# Patient Record
Sex: Female | Born: 1985 | Race: Black or African American | Hispanic: No | Marital: Single | Smoking: Current every day smoker
Health system: Southern US, Community
[De-identification: ages and names within clinical notes are randomized; demographics above are authoritative.]

## PROBLEM LIST (undated history)

## (undated) ENCOUNTER — Emergency Department (HOSPITAL_COMMUNITY): Discharge status: Absent without leave | Payer: No Typology Code available for payment source

---

## 2013-03-06 ENCOUNTER — Emergency Department (HOSPITAL_COMMUNITY)
Admission: EM | Admit: 2013-03-06 | Discharge: 2013-03-06 | Disposition: A | Discharge status: Home / Self Care | Payer: 59 | Attending: Emergency Medicine | Admitting: Emergency Medicine

## 2013-03-06 ENCOUNTER — Encounter (HOSPITAL_COMMUNITY): Payer: Self-pay | Admitting: Emergency Medicine

## 2013-03-06 ENCOUNTER — Emergency Department (INDEPENDENT_AMBULATORY_CARE_PROVIDER_SITE_OTHER)
Admission: EM | Admit: 2013-03-06 | Discharge: 2013-03-06 | Disposition: A | Discharge status: Hospice | Payer: Self-pay | Source: Home / Self Care | Attending: Family Medicine | Admitting: Family Medicine

## 2013-03-06 DIAGNOSIS — H53149 Visual discomfort, unspecified: Secondary | ICD-10-CM | POA: Insufficient documentation

## 2013-03-06 DIAGNOSIS — H538 Other visual disturbances: Secondary | ICD-10-CM | POA: Insufficient documentation

## 2013-03-06 DIAGNOSIS — R51 Headache: Secondary | ICD-10-CM

## 2013-03-06 DIAGNOSIS — R2990 Unspecified symptoms and signs involving the nervous system: Secondary | ICD-10-CM

## 2013-03-06 DIAGNOSIS — R29818 Other symptoms and signs involving the nervous system: Secondary | ICD-10-CM

## 2013-03-06 DIAGNOSIS — R519 Headache, unspecified: Secondary | ICD-10-CM

## 2013-03-06 DIAGNOSIS — Z3202 Encounter for pregnancy test, result negative: Secondary | ICD-10-CM | POA: Insufficient documentation

## 2013-03-06 DIAGNOSIS — J3489 Other specified disorders of nose and nasal sinuses: Secondary | ICD-10-CM | POA: Insufficient documentation

## 2013-03-06 LAB — POCT PREGNANCY, URINE: PREG TEST UR: NEGATIVE

## 2013-03-06 MED ORDER — METOCLOPRAMIDE HCL 5 MG/ML IJ SOLN
10.0000 mg | Freq: Once | INTRAMUSCULAR | Status: AC
  Administered 2013-03-06: 10 mg via INTRAVENOUS
  Filled 2013-03-06: qty 2

## 2013-03-06 MED ORDER — IBUPROFEN 600 MG PO TABS: 600.0000 mg | ORAL_TABLET | Freq: Four times a day (QID) | ORAL | Status: DC | PRN

## 2013-03-06 MED ORDER — DIPHENHYDRAMINE HCL 50 MG/ML IJ SOLN
25.0000 mg | Freq: Once | INTRAMUSCULAR | Status: AC
  Administered 2013-03-06: 25 mg via INTRAVENOUS
  Filled 2013-03-06: qty 1

## 2013-03-06 MED ORDER — DEXAMETHASONE SODIUM PHOSPHATE 10 MG/ML IJ SOLN
10.0000 mg | Freq: Once | INTRAMUSCULAR | Status: AC
  Administered 2013-03-06: 10 mg via INTRAVENOUS
  Filled 2013-03-06: qty 1

## 2013-03-06 MED ORDER — SODIUM CHLORIDE 0.9 % IV BOLUS (SEPSIS)
1000.0000 mL | Freq: Once | INTRAVENOUS | Status: AC
  Administered 2013-03-06: 1000 mL via INTRAVENOUS

## 2013-03-06 NOTE — ED Provider Notes (Signed)
Medical screening examination/treatment/procedure(s) were performed by a resident physician or non-physician practitioner and as the supervising physician I was immediately available for consultation/collaboration.  Jabarie Pop, MD    Nahum Sherrer S Santanna Olenik, MD 03/06/13 2029 

## 2013-03-06 NOTE — Discharge Instructions (Signed)
Migraine Headache A migraine headache is an intense, throbbing pain on one or both sides of your head. A migraine can last for 30 minutes to several hours. CAUSES  The exact cause of a migraine headache is not always known. However, a migraine may be caused when nerves in the brain become irritated and release chemicals that cause inflammation. This causes pain. Certain things may also trigger migraines, such as:  Alcohol.  Smoking.  Stress.  Menstruation.  Aged cheeses.  Foods or drinks that contain nitrates, glutamate, aspartame, or tyramine.  Lack of sleep.  Chocolate.  Caffeine.  Hunger.  Physical exertion.  Fatigue.  Medicines used to treat chest pain (nitroglycerine), birth control pills, estrogen, and some blood pressure medicines. SIGNS AND SYMPTOMS  Pain on one or both sides of your head.  Pulsating or throbbing pain.  Severe pain that prevents daily activities.  Pain that is aggravated by any physical activity.  Nausea, vomiting, or both.  Dizziness.  Pain with exposure to bright lights, loud noises, or activity.  General sensitivity to bright lights, loud noises, or smells. Before you get a migraine, you may get warning signs that a migraine is coming (aura). An aura may include:  Seeing flashing lights.  Seeing bright spots, halos, or zig-zag lines.  Having tunnel vision or blurred vision.  Having feelings of numbness or tingling.  Having trouble talking.  Having muscle weakness. DIAGNOSIS  A migraine headache is often diagnosed based on:  Symptoms.  Physical exam.  A CT scan or MRI of your head. These imaging tests cannot diagnose migraines, but they can help rule out other causes of headaches. TREATMENT Medicines may be given for pain and nausea. Medicines can also be given to help prevent recurrent migraines.  HOME CARE INSTRUCTIONS  Only take over-the-counter or prescription medicines for pain or discomfort as directed by your  health care provider. The use of long-term narcotics is not recommended.  Lie down in a dark, quiet room when you have a migraine.  Keep a journal to find out what may trigger your migraine headaches. For example, write down:  What you eat and drink.  How much sleep you get.  Any change to your diet or medicines.  Limit alcohol consumption.  Quit smoking if you smoke.  Get 7 9 hours of sleep, or as recommended by your health care provider.  Limit stress.  Keep lights dim if bright lights bother you and make your migraines worse. SEEK IMMEDIATE MEDICAL CARE IF:   Your migraine becomes severe.  You have a fever.  You have a stiff neck.  You have vision loss.  You have muscular weakness or loss of muscle control.  You start losing your balance or have trouble walking.  You feel faint or pass out.  You have severe symptoms that are different from your first symptoms. MAKE SURE YOU:   Understand these instructions.  Will watch your condition.  Will get help right away if you are not doing well or get worse. Document Released: 01/14/2005 Document Revised: 11/04/2012 Document Reviewed: 09/21/2012 [employer] Patient Information 2014 [employer].  

## 2013-03-06 NOTE — ED Notes (Signed)
C/o headache States she has had a headache since Monday Has used tylenol and advil but no relief.  States she has had nausea this morning only  Does have ringing in head Does have head pressure

## 2013-03-06 NOTE — ED Notes (Signed)
MD at bedside to reevaluate patient

## 2013-03-06 NOTE — ED Notes (Signed)
[facility] headache intermittent since Monday and ringing in ears. Took otc meds with no relief. Pain is better when lying flat. Denies fevers

## 2013-03-06 NOTE — ED Provider Notes (Signed)
CSN: [id number]     Arrival date & time 03/06/13  [id number] History   First MD Initiated Contact with Patient 03/06/13 1003     Chief Complaint  Patient presents with  . Headache   (Consider location/radiation/quality/duration/timing/severity/associated sxs/prior Treatment) HPI Comments: 28 year old female presents for evaluation of headache. Since Monday of this week, she has had a constant headache that increases and decreases in severity behind both eyes. It feels like severe pressure in her head. In addition to the headache, she has also had some nausea, ringing in her ears, lightheadedness, vertigo. She has taken ibuprofen and indomethacin for this, it helped slightly but the headache just returns when the medicine wears off. Headache is somewhat relieved by lying flat and is exacerbated by sitting up. She does not feel sick at this time. She has no significant history of headaches and no history of migraines.  Patient is a 28 y.o. female presenting with headaches.  Headache Associated symptoms: dizziness and photophobia   Associated symptoms: no abdominal pain, no cough, no fever, no myalgias, no nausea and no vomiting     History reviewed. No pertinent past medical history. No past surgical history on file. No family history on file. History  Substance Use Topics  . Smoking status: Not on file  . Smokeless tobacco: Not on file  . Alcohol Use: Not on file   OB History   Grav Para Term Preterm Abortions TAB SAB Ect Mult Living                 Review of Systems  Constitutional: Negative for fever and chills.  Eyes: Positive for photophobia and visual disturbance.  Respiratory: Negative for cough and shortness of breath.   Cardiovascular: Negative for chest pain, palpitations and leg swelling.  Gastrointestinal: Negative for nausea, vomiting and abdominal pain.  Endocrine: Negative for polydipsia and polyuria.  Genitourinary: Negative for dysuria, urgency and frequency.   Musculoskeletal: Negative for arthralgias and myalgias.  Skin: Negative for rash.  Neurological: Positive for dizziness, light-headedness and headaches. Negative for weakness.    Allergies  Review of patient's allergies indicates no known allergies.  Home Medications  No current outpatient prescriptions on file. BP 117/74  Pulse 72  Temp(Src) 98.5 F (36.9 C) (Oral)  Resp 22  SpO2 100% Physical Exam  Nursing note and vitals reviewed. Constitutional: She is oriented to person, place, and time. Vital signs are normal. She appears well-developed and well-nourished. No distress.  HENT:  Head: Normocephalic and atraumatic.  Nose: Right sinus exhibits frontal sinus tenderness. Left sinus exhibits frontal sinus tenderness.  Eyes: Right eye exhibits abnormal extraocular motion. Left eye exhibits abnormal extraocular motion.  Gaze tracking is abnormal, not smooth   Does not tolerate fundoscopic exam   Cardiovascular: Normal rate, regular rhythm and normal heart sounds.  Exam reveals no gallop and no friction rub.   No murmur heard. Pulmonary/Chest: Effort normal and breath sounds normal. No respiratory distress. She has no wheezes. She has no rales.  Neurological: She is alert and oriented to person, place, and time. She has normal strength and normal reflexes. A cranial nerve deficit (EOMs abnormal) is present. No sensory deficit. She exhibits normal muscle tone. She displays a negative Romberg sign. Coordination and gait normal. GCS eye subscore is 4. GCS verbal subscore is 5. GCS motor subscore is 6.  Skin: Skin is warm and dry. No rash noted. She is not diaphoretic.  Psychiatric: She has a normal mood and affect. Judgment normal.  ED Course  Procedures (including critical care time) Labs Review Labs Reviewed - No data to display Imaging Review No results found.    MDM   1. Headache   2. Abnormal neurological exam    Dr. Denyse Amass has seen this patient as well, but believe  this patient needs further evaluation in the emergency department given the headache with abnormal neurologic exam and no relevant history. Transferred to ED via shuttle    Graylon Good, PA-C 03/06/13 1039

## 2013-03-06 NOTE — ED Provider Notes (Signed)
CSN: [id number]     Arrival date & time 03/06/13  1046 History   First MD Initiated Contact with Patient 03/06/13 1108     Chief Complaint  Patient presents with  . Headache   (Consider location/radiation/quality/duration/timing/severity/associated sxs/prior Treatment) Patient is a 28 y.o. female presenting with headaches. The history is provided by the patient.  Headache Pain location:  Frontal Quality:  Sharp Radiates to:  Does not radiate Severity currently:  9/10 Severity at highest:  9/10 Onset quality:  Gradual Duration:  6 days Timing:  Constant Progression:  Waxing and waning Chronicity:  New Similar to prior headaches: no   Context: bright light   Context: not caffeine, not coughing, not loud noise and not straining   Relieved by:  Nothing Worsened by:  Nothing tried Associated symptoms: blurred vision (when trying to focus, not constantly), photophobia and sinus pressure   Associated symptoms: no abdominal pain, no cough, no diarrhea, no dizziness, no drainage, no facial pain, no fever, no nausea, no neck stiffness, no numbness and no URI     History reviewed. No pertinent past medical history. History reviewed. No pertinent past surgical history. History reviewed. No pertinent family history. History  Substance Use Topics  . Smoking status: Not on file  . Smokeless tobacco: Not on file  . Alcohol Use: Not on file   OB History   Grav Para Term Preterm Abortions TAB SAB Ect Mult Living                 Review of Systems  Constitutional: Negative for fever.  HENT: Positive for sinus pressure. Negative for postnasal drip.   Eyes: Positive for blurred vision (when trying to focus, not constantly) and photophobia.  Respiratory: Negative for cough.   Gastrointestinal: Negative for nausea, abdominal pain and diarrhea.  Musculoskeletal: Negative for neck stiffness.  Neurological: Positive for headaches. Negative for dizziness and numbness.  All other systems  reviewed and are negative.    Allergies  Review of patient's allergies indicates no known allergies.  Home Medications  No current outpatient prescriptions on file. BP 121/66  Pulse 67  Temp(Src) 98.3 F (36.8 C) (Oral)  Resp 20  Ht 5\' 5"  (1.651 m)  Wt 125 lb (56.7 kg)  BMI 20.80 kg/m2  SpO2 98% Physical Exam  Nursing note and vitals reviewed. Constitutional: She is oriented to person, place, and time. She appears well-developed and well-nourished. No distress.  HENT:  Head: Normocephalic and atraumatic.  Eyes: EOM are normal. Pupils are equal, round, and reactive to light.  Neck: Normal range of motion. Neck supple.  Cardiovascular: Normal rate and regular rhythm.  Exam reveals no friction rub.   No murmur heard. Pulmonary/Chest: Effort normal and breath sounds normal. No respiratory distress. She has no wheezes. She has no rales.  Abdominal: Soft. She exhibits no distension. There is no tenderness. There is no rebound.  Musculoskeletal: Normal range of motion. She exhibits no edema.  Neurological: She is alert and oriented to person, place, and time. No cranial nerve deficit or sensory deficit. She exhibits normal muscle tone. Coordination and gait normal. GCS eye subscore is 4. GCS verbal subscore is 5. GCS motor subscore is 6.  Normal EOMs on my exam  Skin: She is not diaphoretic.    ED Course  Procedures (including critical care time) Labs Review Labs Reviewed  POCT PREGNANCY, URINE   Imaging Review No results found.  EKG Interpretation   None       MDM  1. Headache    28 year old female sent here for headache from urgent care. At urgent care it was noted that she had abnormal extraocular movement. She had difficulty with tracking per their exam. She's here for further evaluation. Headache has been constant since Monday, which is 6 days ago. He was gradual onset with waxing and waning periods. No fever, rhinorrhea, sore throat, nausea, vomiting. She does  not have a history of headaches. I spoke with the PA at Urgent Care who evaluated her, he described nystagmus as the abnormal EOMs. Patient here without nystagmus, relaxing comfortably, cranial nerves normal.  Will give headache cocktail.  Patient sleeping comfortably on re-exam, and on second re-exam, states headache resolved. Stable for discharge.   Dagmar Hait, MD 03/06/13 1329

## 2013-06-08 ENCOUNTER — Encounter: Payer: Self-pay | Admitting: Family

## 2013-06-08 ENCOUNTER — Telehealth: Payer: Self-pay | Admitting: Family

## 2013-06-08 ENCOUNTER — Ambulatory Visit (INDEPENDENT_AMBULATORY_CARE_PROVIDER_SITE_OTHER): Payer: 59 | Admitting: Family

## 2013-06-08 VITALS — BP 100/60 | HR 59 | Ht 63.75 in | Wt 133.0 lb

## 2013-06-08 DIAGNOSIS — Z0000 Encounter for general adult medical examination without abnormal findings: Secondary | ICD-10-CM

## 2013-06-08 DIAGNOSIS — Z3009 Encounter for other general counseling and advice on contraception: Secondary | ICD-10-CM

## 2013-06-08 DIAGNOSIS — Z23 Encounter for immunization: Secondary | ICD-10-CM

## 2013-06-08 NOTE — Progress Notes (Signed)
Subjective:    Patient ID: Patricia Beard, female    DOB: 1985-05-14, 28 y.o.   MRN: [id number]  HPI 28 year old [place], new patient to the practice and to be established and for complete physical exam. Denies any concerns. Is requesting a referral to gynecology to have IUD removed. She would like replacement. Is a 27-year-old female child who is autistic.   Review of Systems  Constitutional: Negative.   HENT: Negative.   Eyes: Negative.   Respiratory: Negative.   Cardiovascular: Negative.   Gastrointestinal: Negative.   Endocrine: Negative.   Genitourinary: Negative.   Musculoskeletal: Negative.   Skin: Negative.   Allergic/Immunologic: Negative.   Neurological: Negative.   Hematological: Negative.   Psychiatric/Behavioral: Negative.    No past medical history on file.  History   Social History  . Marital Status: Single    Spouse Name: N/A    Number of Children: N/A  . Years of Education: N/A   Occupational History  . Not on file.   Social History Main Topics  . Smoking status: Current Every Day Smoker  . Smokeless tobacco: Not on file  . Alcohol Use: Yes  . Drug Use: No  . Sexual Activity: Not on file   Other Topics Concern  . Not on file   Social History Narrative  . No narrative on file    No past surgical history on file.  No family history on file.  No Known Allergies  Current Outpatient Prescriptions on File Prior to Visit  Medication Sig Dispense Refill  . acetaminophen (TYLENOL) 500 MG tablet Take 1,000 mg by mouth every 6 (six) hours as needed.      Marland Kitchen. ibuprofen (ADVIL,MOTRIN) 200 MG tablet Take 400 mg by mouth every 6 (six) hours as needed (pain).       No current facility-administered medications on file prior to visit.    BP 100/60  Pulse 59  Ht 5' 3.75" (1.619 m)  Wt 133 lb (60.328 kg)  BMI 23.02 kg/m2  SpO2 94%chart    Objective:   Physical Exam  Constitutional: She appears well-developed and well-nourished.    HENT:  Head: Normocephalic and atraumatic.  Right Ear: External ear normal.  Left Ear: External ear normal.  Nose: Nose normal.  Mouth/Throat: Oropharynx is clear and moist.  Eyes: Conjunctivae and EOM are normal. Pupils are equal, round, and reactive to light.  Neck: Normal range of motion. Neck supple.  Cardiovascular: Normal rate, regular rhythm and normal heart sounds.   Pulmonary/Chest: Effort normal and breath sounds normal.  Abdominal: Soft. Bowel sounds are normal.  Musculoskeletal: Normal range of motion.  Neurological: She is alert. She has normal reflexes. She displays normal reflexes. No cranial nerve deficit. Coordination normal.  Skin: Skin is warm and dry.  Psychiatric: She has a normal mood and affect.          Assessment & Plan:  Tobey GrimCyearea was seen today for establish care.  Diagnoses and associated orders for this visit:  Counseling for birth control regarding intrauterine device (IUD) - POC Urinalysis Dipstick - CBC with Differential - TSH - Lipid Panel - CMP - Ambulatory referral to Gynecology  Preventative health care - POC Urinalysis Dipstick - CBC with Differential - TSH - Lipid Panel - CMP  Need for prophylactic vaccination with diphtheria-tetanus-pertussis with poliomyelitis (DTP + polio) vaccine - Tdap vaccine greater than or equal to 7yo IM   Call the office with any questions or concerns. Recheck in 1 year  and as needed.

## 2013-06-08 NOTE — Telephone Encounter (Signed)
Relevant patient education mailed to patient.  

## 2013-06-08 NOTE — Progress Notes (Signed)
Pre visit review using our clinic review tool, if applicable. No additional management support is needed unless otherwise documented below in the visit note. 

## 2013-06-08 NOTE — Patient Instructions (Signed)

## 2013-06-15 ENCOUNTER — Other Ambulatory Visit: Payer: 59

## 2013-06-16 ENCOUNTER — Other Ambulatory Visit (INDEPENDENT_AMBULATORY_CARE_PROVIDER_SITE_OTHER): Payer: 59

## 2013-06-16 DIAGNOSIS — Z0000 Encounter for general adult medical examination without abnormal findings: Secondary | ICD-10-CM

## 2013-06-16 LAB — CBC WITH DIFFERENTIAL/PLATELET
BASOS PCT: 0.5 % (ref 0.0–3.0)
Basophils Absolute: 0 10*3/uL (ref 0.0–0.1)
Eosinophils Absolute: 0.1 10*3/uL (ref 0.0–0.7)
Eosinophils Relative: 2.1 % (ref 0.0–5.0)
HCT: 43.3 % (ref 36.0–46.0)
Hemoglobin: 14.3 g/dL (ref 12.0–15.0)
LYMPHS PCT: 28.8 % (ref 12.0–46.0)
Lymphs Abs: 1.6 10*3/uL (ref 0.7–4.0)
MCHC: 33 g/dL (ref 30.0–36.0)
MCV: 88.4 fl (ref 78.0–100.0)
MONO ABS: 0.6 10*3/uL (ref 0.1–1.0)
Monocytes Relative: 11.1 % (ref 3.0–12.0)
NEUTROS PCT: 57.5 % (ref 43.0–77.0)
Neutro Abs: 3.2 10*3/uL (ref 1.4–7.7)
PLATELETS: 243 10*3/uL (ref 150.0–400.0)
RBC: 4.9 Mil/uL (ref 3.87–5.11)
RDW: 14.9 % (ref 11.5–15.5)
WBC: 5.5 10*3/uL (ref 4.0–10.5)

## 2013-06-16 LAB — BASIC METABOLIC PANEL
BUN: 8 mg/dL (ref 6–23)
CALCIUM: 9.3 mg/dL (ref 8.4–10.5)
CHLORIDE: 101 meq/L (ref 96–112)
CO2: 27 mEq/L (ref 19–32)
CREATININE: 0.8 mg/dL (ref 0.4–1.2)
GFR: 113.81 mL/min (ref >60.00)
Glucose, Bld: 66 mg/dL — ABNORMAL LOW (ref 70–99)
Potassium: 4.7 mEq/L (ref 3.5–5.1)
Sodium: 136 mEq/L (ref 135–145)

## 2013-06-16 LAB — POCT URINALYSIS DIPSTICK
BILIRUBIN UA: NEGATIVE
Blood, UA: NEGATIVE
Glucose, UA: NEGATIVE
KETONES UA: NEGATIVE
LEUKOCYTES UA: NEGATIVE
Nitrite, UA: NEGATIVE
Protein, UA: NEGATIVE
SPEC GRAV UA: 1.02
Urobilinogen, UA: 0.2
pH, UA: 7

## 2013-06-16 LAB — HEPATIC FUNCTION PANEL
ALK PHOS: 48 U/L (ref 39–117)
ALT: 16 U/L (ref 0–35)
AST: 18 U/L (ref 0–37)
Albumin: 4.3 g/dL (ref 3.5–5.2)
BILIRUBIN DIRECT: 0.1 mg/dL (ref 0.0–0.3)
BILIRUBIN TOTAL: 0.6 mg/dL (ref 0.2–1.2)
Total Protein: 7.1 g/dL (ref 6.0–8.3)

## 2013-06-16 LAB — LIPID PANEL
Cholesterol: 141 mg/dL (ref 0–200)
HDL: 56.1 mg/dL (ref >39.00)
LDL Cholesterol: 75 mg/dL (ref 0–99)
TRIGLYCERIDES: 51 mg/dL (ref 0.0–149.0)
Total CHOL/HDL Ratio: 3
VLDL: 10.2 mg/dL (ref 0.0–40.0)

## 2013-06-16 LAB — TSH: TSH: 0.56 u[IU]/mL (ref 0.35–4.50)

## 2013-06-22 ENCOUNTER — Telehealth: Payer: Self-pay | Admitting: Family

## 2013-06-22 NOTE — Telephone Encounter (Signed)
Pt returning your call about lab results. pls call

## 2013-06-22 NOTE — Telephone Encounter (Signed)
Called and spoke with pt and pt is aware.  

## 2013-06-23 ENCOUNTER — Ambulatory Visit: Payer: Self-pay | Admitting: Obstetrics and Gynecology

## 2013-06-24 ENCOUNTER — Telehealth: Payer: Self-pay | Admitting: Family

## 2013-06-24 NOTE — Telephone Encounter (Signed)
Pt had appt with dr Edward Jolly  On 06-23-13. Pt got confused and went to appt today 06-24-13. Pt stated their office said she needs another referral.

## 2013-07-12 ENCOUNTER — Telehealth: Payer: Self-pay | Admitting: Family

## 2013-07-12 NOTE — Telephone Encounter (Signed)
error/gd °

## 2013-07-27 ENCOUNTER — Telehealth: Payer: Self-pay | Admitting: Family

## 2013-07-27 NOTE — Telephone Encounter (Signed)
Pt called back today to fu on obgyn referral. Pt requested names to call herself. Pt was given names of the other obgyn office for pt to call,

## 2013-11-29 ENCOUNTER — Encounter: Payer: Self-pay | Admitting: Family Medicine

## 2013-11-29 ENCOUNTER — Ambulatory Visit (INDEPENDENT_AMBULATORY_CARE_PROVIDER_SITE_OTHER): Payer: Managed Care, Other (non HMO) | Admitting: Family Medicine

## 2013-11-29 VITALS — BP 102/64 | Temp 99.0°F | Ht 63.75 in | Wt 136.0 lb

## 2013-11-29 DIAGNOSIS — M26609 Unspecified temporomandibular joint disorder, unspecified side: Secondary | ICD-10-CM

## 2013-11-29 DIAGNOSIS — M2660 Temporomandibular joint disorder, unspecified: Secondary | ICD-10-CM

## 2013-11-29 NOTE — Progress Notes (Signed)
Pre visit review using our clinic review tool, if applicable. No additional management support is needed unless otherwise documented below in the visit note. 

## 2013-11-30 DIAGNOSIS — M26609 Unspecified temporomandibular joint disorder, unspecified side: Secondary | ICD-10-CM | POA: Insufficient documentation

## 2013-11-30 NOTE — Progress Notes (Signed)
   Subjective:    Patient ID: Patricia Beard, female    DOB: 1985-03-15, 28 y.o.   MRN: [id number]  HPI Here for several weeks of pain in the left ear. No sinus pressure or PND or ST. She admits to being under a lot of stress for the past month.    Review of Systems  Constitutional: Negative.   HENT: Positive for ear pain. Negative for congestion, ear discharge, facial swelling, hearing loss, postnasal drip, sinus pressure and sore throat.   Eyes: Negative.   Respiratory: Negative.   Neurological: Negative.        Objective:   Physical Exam  Constitutional: She appears well-developed and well-nourished.  HENT:  Head: Normocephalic and atraumatic.  Right Ear: External ear normal.  Left Ear: External ear normal.  Mouth/Throat: Oropharynx is clear and moist.  She is quite tender over the left TMJ area, full ROM, mild crepitus is present  Eyes: Conjunctivae and EOM are normal. Pupils are equal, round, and reactive to light.  Neck: Neck supple. No thyromegaly present.  Pulmonary/Chest: Effort normal and breath sounds normal.  Lymphadenopathy:    She has no cervical adenopathy.          Assessment & Plan:  TMJ pain. I assured her that her ears are fine. This is probably stress related. I advised her to see her dentist ASAP. Stop chewing gum. Use Motrin prn

## 2013-12-06 ENCOUNTER — Encounter: Payer: Self-pay | Admitting: Internal Medicine

## 2013-12-06 ENCOUNTER — Ambulatory Visit (INDEPENDENT_AMBULATORY_CARE_PROVIDER_SITE_OTHER): Payer: Managed Care, Other (non HMO) | Admitting: Internal Medicine

## 2013-12-06 VITALS — BP 124/80 | Temp 98.9°F | Wt 140.6 lb

## 2013-12-06 DIAGNOSIS — J392 Other diseases of pharynx: Secondary | ICD-10-CM

## 2013-12-06 DIAGNOSIS — R109 Unspecified abdominal pain: Secondary | ICD-10-CM

## 2013-12-06 DIAGNOSIS — J069 Acute upper respiratory infection, unspecified: Secondary | ICD-10-CM

## 2013-12-06 DIAGNOSIS — R6889 Other general symptoms and signs: Secondary | ICD-10-CM

## 2013-12-06 DIAGNOSIS — Z975 Presence of (intrauterine) contraceptive device: Secondary | ICD-10-CM

## 2013-12-06 NOTE — Patient Instructions (Addendum)
This acts like flu like illness   i advise    Decongestants if needed for nasal  congestion .   Gargling.  Bed rest fluids no etoh.   Avoid juices  No alcohol  Suggest  .  Eventually get gyne appt.  As discussed  Can make own appt . Also .

## 2013-12-06 NOTE — Progress Notes (Signed)
Pre visit review using our clinic review tool, if applicable. No additional management support is needed unless otherwise documented below in the visit note.  Chief Complaint  Patient presents with  . Sore Throat  . Nasal Congestion  . Sneezing  . Cough  . Generalized Body Aches  . Chills  . Post Nasal Drip    HPI: Patient Patricia Beard  comes in today for SDA for  new problem evaluation. PCP NA onset  4 days ago  . Scratchy throat sneeicn body aches some cough nasal congestion  loos stoll todayy Some lower abd pressure cramps  Has iud but missed appt  hasnt seen gyne yet.   No uti sx  Some dc  Drinks 1 pint etoh  per day ofent brandy but not in past few days because of illness . Has autistic child  ROS: See pertinent positives and negatives per HPI. No sob  Has iud  No nv   Syncope  No uti sx somea bd pressure 1 partners  No condoms reg feels like there something in her left throat in tries to scratch her throat is see if it goes down. No specific injury no episode of choking.  No past medical history on file.  No family history on file. 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 Marital Status: Single    Spouse Name: N/A    Number of Children: N/A  . Years of Education: N/A   Social History Main Topics  . Smoking status: Current Every Day Smoker    Types: Cigarettes  . Smokeless tobacco: Never Used     Comment: 4-5 cigarettes per day  . Alcohol Use: 1.2 oz/week    2 Not specified per week     Comment: brandy  . Drug Use: No  . Sexual Activity: None   Other Topics Concern  . None   Social History Narrative    Outpatient Encounter Prescriptions as of 12/06/2013  Medication Sig  . levonorgestrel (MIRENA) 20 MCG/24HR IUD 1 each by Intrauterine route once.  Marland Kitchen. acetaminophen (TYLENOL) 500 MG tablet Take 500 mg by mouth every 6 (six) hours as needed.   Marland Kitchen. ibuprofen (ADVIL,MOTRIN) 200 MG tablet Take 400 mg by mouth every 6 (six) hours as  needed (pain).    EXAM:  BP 124/80 mmHg  Temp(Src) 98.9 F (37.2 C) (Oral)  Wt 140 lb 9.6 oz (63.776 kg)  Body mass index is 24.33 kg/(m^2).  GENERAL: vitals reviewed and listed above, alert, oriented, appears well hydrated and in no acute distress very congested nasal  HEENT: atraumatic, conjunctiva  clear, no obvious abnormalities on inspection of external nose dc noted no face pain and earstms clear  OP : no lesion edema or exudate  Red 1 +  NECK: no obvious masses on inspection palpation  LUNGS: clear to auscultation bilaterally, no wheezes, rales or rhonchi, good air movement CV: HRRR, no clubbing cyanosis or  peripheral edema nl cap refill  ABD sore lower  r ,ore than left no rebound bs NA  PSYCH: pleasant and cooperative, ASSESSMENT AND PLAN:  Discussed the following assessment and plan:  Acute upper respiratory infection of multiple sites  Flu-like symptoms  Abdominal pressure  Throat irritation - left ? frm PND?  -Patient advised to return or notify health care team  if symptoms worsen ,persist or new concerns arise.  Patient Instructions  This acts like flu like illness   i advise  Decongestants if needed for nasal  congestion .   Gargling.  Bed rest fluids no etoh.   Avoid juices  No alcohol  Suggest  .  Eventually get gyne appt.  As discussed  Can make own appt . Also .    Neta MendsWanda K. Beard Signor M.D.

## 2015-08-11 ENCOUNTER — Ambulatory Visit: Payer: Managed Care, Other (non HMO) | Admitting: Family Medicine

## 2015-09-05 ENCOUNTER — Ambulatory Visit: Payer: Managed Care, Other (non HMO) | Admitting: Family Medicine

## 2017-04-17 ENCOUNTER — Emergency Department (HOSPITAL_COMMUNITY)
Admission: EM | Admit: 2017-04-17 | Discharge: 2017-04-17 | Discharge status: Against Medical Advice | Payer: Managed Care, Other (non HMO)

## 2017-04-17 NOTE — ED Notes (Signed)
Pt came into triage room at 0235 after being registered. Pt informed of process in triage and of need to go back to the lobby after triage. Pt does not want to wait at this time.

## 2017-11-03 ENCOUNTER — Emergency Department (HOSPITAL_COMMUNITY)
Admission: EM | Admit: 2017-11-03 | Discharge: 2017-11-03 | Disposition: A | Discharge status: Home / Self Care | Payer: No Typology Code available for payment source | Attending: Emergency Medicine | Admitting: Emergency Medicine

## 2017-11-03 ENCOUNTER — Encounter (HOSPITAL_COMMUNITY): Payer: Self-pay | Admitting: *Deleted

## 2017-11-03 DIAGNOSIS — R112 Nausea with vomiting, unspecified: Secondary | ICD-10-CM | POA: Diagnosis present

## 2017-11-03 DIAGNOSIS — Z79899 Other long term (current) drug therapy: Secondary | ICD-10-CM | POA: Insufficient documentation

## 2017-11-03 DIAGNOSIS — F17210 Nicotine dependence, cigarettes, uncomplicated: Secondary | ICD-10-CM | POA: Insufficient documentation

## 2017-11-03 DIAGNOSIS — K529 Noninfective gastroenteritis and colitis, unspecified: Secondary | ICD-10-CM | POA: Insufficient documentation

## 2017-11-03 LAB — POC URINE PREG, ED: Preg Test, Ur: NEGATIVE

## 2017-11-03 MED ORDER — ONDANSETRON 8 MG PO TBDP
8.0000 mg | ORAL_TABLET | Freq: Once | ORAL | Status: AC
  Administered 2017-11-03: 8 mg via ORAL
  Filled 2017-11-03: qty 1

## 2017-11-03 MED ORDER — ONDANSETRON 8 MG PO TBDP: 8.0000 mg | ORAL_TABLET | Freq: Three times a day (TID) | ORAL | 0 refills | Status: DC | PRN

## 2017-11-03 NOTE — Discharge Instructions (Signed)
We suspect that your symptoms are because of gastroenteritis. Please take the nausea medication. Read the instructions on clear liquid diet, which we recommend for the next 2 days before transitioning to more solid foods.

## 2017-11-03 NOTE — ED Triage Notes (Signed)
Pt complains of nausea and vomiting since this morning. Pt states she felt hungover yesterday after drinking alcohol the night before. Pt went to [employer] yesterday. Pt denies abdominal pain.

## 2017-11-03 NOTE — ED Provider Notes (Signed)
[facility]   CSN: [id number] Arrival date & time: 11/03/17  0756     History   Chief Complaint Chief Complaint  Patient presents with  . Emesis  . Nausea    HPI Patricia Beard is a 32 y.o. female.  HPI   32 year old female with history of nausea and vomiting.  Patient reports that she started having nausea and vomiting earlier today.  She has had about 5 episodes of emesis, nonbilious and nonbloody.  Patient denies any abdominal pain besides minor discomfort.  She has no diarrhea.  Patient does admit to having seafood that had been out all night yesterday.  She denies any fevers, chills.  No recent antibiotics or travel history.  Patient is sure that she is not pregnant.  History reviewed. No pertinent past medical history.  Patient Active Problem List   Diagnosis Date Noted  . IUD (intrauterine device) in place 12/06/2013  . TMJ (temporomandibular joint syndrome) 11/30/2013    History reviewed. No pertinent surgical history.   OB History   None      Home Medications    Prior to Admission medications   Medication Sig Start Date End Date Taking? Authorizing Provider  acetaminophen (TYLENOL) 500 MG tablet Take 500 mg by mouth every 6 (six) hours as needed for mild pain.    Yes [provider]  ibuprofen (ADVIL,MOTRIN) 200 MG tablet Take 400 mg by mouth every 6 (six) hours as needed for headache or mild pain.    Yes [provider]  levonorgestrel (MIRENA) 20 MCG/24HR IUD 1 each by Intrauterine route once.   Yes [provider]  ondansetron (ZOFRAN ODT) 8 MG disintegrating tablet Take 1 tablet (8 mg total) by mouth every 8 (eight) hours as needed for nausea. 11/03/17   Derwood Kaplan, MD    Family History No family history on file.  Social History Social History   Tobacco Use  . Smoking status: Current Every Day Smoker    Types: Cigarettes  . Smokeless tobacco: Never Used  .  Tobacco comment: 4-5 cigarettes per day  Substance Use Topics  . Alcohol use: Yes    Alcohol/week: 2.0 standard drinks    Types: 2 Standard drinks or equivalent per week    Comment: brandy  . Drug use: No     Allergies   Patient has no known allergies.   Review of Systems Review of Systems  Constitutional: Positive for activity change.  Respiratory: Negative for shortness of breath.   Cardiovascular: Negative for chest pain.  Gastrointestinal: Positive for nausea and vomiting. Negative for diarrhea.  Allergic/Immunologic: Negative for immunocompromised state.     Physical Exam Updated Vital Signs BP (!) 143/71 (BP Location: Right Arm)   Pulse 75   Temp 98.1 F (36.7 C) (Oral)   Resp 18   SpO2 99%   Physical Exam  Constitutional: She appears well-developed.  HENT:  Head: Normocephalic and atraumatic.  Eyes: EOM are normal.  Neck: Neck supple.  Cardiovascular: Normal rate.  Pulmonary/Chest: Effort normal.  Abdominal: Bowel sounds are normal.  Neurological: She is alert.  Skin: Skin is warm.  Nursing note and vitals reviewed.    ED Treatments / Results  Labs (all labs ordered are listed, but only abnormal results are displayed) Labs Reviewed  POC URINE PREG, ED    EKG None  Radiology No results found.  Procedures Procedures (including critical care time)  Medications Ordered in ED Medications  ondansetron (ZOFRAN-ODT) disintegrating  tablet 8 mg (8 mg Oral Given 11/03/17 0831)     Initial Impression / Assessment and Plan / ED Course  I have reviewed the triage vital signs and the nursing notes.  Pertinent labs & imaging results that were available during my care of the patient were reviewed by me and considered in my medical decision making (see chart for details).  Clinical Course as of Nov 03 1520  [facility] Nov 03, 2017  9604 Pt reassessed. Pt's VSS and WNL. Pt's cap refill < 3 seconds. Pt has been hydrated in the ER and now passed po  challenge. We will discharge with antiemetic. Strict ER return precautions have been discussed and pt will return if he is unable to tolerate fluids and symptoms are getting worse.    [AN]    Clinical Course User Index [AN] Derwood Kaplan, MD    32 year old female comes in with chief complaint of nausea and vomiting.  She is not have any abdominal pain or diarrhea.  No fevers.  Her exam overall is benign.  We will start oral challenge as this appears to be rather gastroenteritis.  She also had heavy drinking yesterday, and so this could be related to binge drinking and hangover.  Final Clinical Impressions(s) / ED Diagnoses   Final diagnoses:  Gastroenteritis    ED Discharge Orders         Ordered    ondansetron (ZOFRAN ODT) 8 MG disintegrating tablet  Every 8 hours PRN     11/03/17 0915           Derwood Kaplan, MD 11/03/17 1527

## 2018-04-08 ENCOUNTER — Other Ambulatory Visit: Payer: Self-pay | Admitting: Physician Assistant

## 2018-12-15 ENCOUNTER — Emergency Department (HOSPITAL_COMMUNITY)
Admission: EM | Admit: 2018-12-15 | Discharge: 2018-12-16 | Disposition: A | Discharge status: Home / Self Care | Payer: PRIVATE HEALTH INSURANCE | Attending: Emergency Medicine | Admitting: Emergency Medicine

## 2018-12-15 ENCOUNTER — Encounter (HOSPITAL_COMMUNITY): Payer: Self-pay | Admitting: Emergency Medicine

## 2018-12-15 ENCOUNTER — Other Ambulatory Visit: Payer: Self-pay

## 2018-12-15 ENCOUNTER — Emergency Department (HOSPITAL_COMMUNITY): Payer: PRIVATE HEALTH INSURANCE

## 2018-12-15 DIAGNOSIS — R1084 Generalized abdominal pain: Secondary | ICD-10-CM | POA: Diagnosis not present

## 2018-12-15 DIAGNOSIS — R112 Nausea with vomiting, unspecified: Secondary | ICD-10-CM | POA: Diagnosis not present

## 2018-12-15 DIAGNOSIS — F17210 Nicotine dependence, cigarettes, uncomplicated: Secondary | ICD-10-CM | POA: Diagnosis not present

## 2018-12-15 DIAGNOSIS — Z975 Presence of (intrauterine) contraceptive device: Secondary | ICD-10-CM | POA: Diagnosis not present

## 2018-12-15 LAB — CBC
HCT: 46.9 % — ABNORMAL HIGH (ref 36.0–46.0)
Hemoglobin: 15.4 g/dL — ABNORMAL HIGH (ref 12.0–15.0)
MCH: 29.9 pg (ref 26.0–34.0)
MCHC: 32.8 g/dL (ref 30.0–36.0)
MCV: 91.1 fL (ref 80.0–100.0)
Platelets: 343 10*3/uL (ref 150–400)
RBC: 5.15 MIL/uL — ABNORMAL HIGH (ref 3.87–5.11)
RDW: 15 % (ref 11.5–15.5)
WBC: 9.9 10*3/uL (ref 4.0–10.5)
nRBC: 0 % (ref 0.0–0.2)

## 2018-12-15 LAB — COMPREHENSIVE METABOLIC PANEL
ALT: 41 U/L (ref 0–44)
AST: 38 U/L (ref 15–41)
Albumin: 5.3 g/dL — ABNORMAL HIGH (ref 3.5–5.0)
Alkaline Phosphatase: 68 U/L (ref 38–126)
Anion gap: 18 — ABNORMAL HIGH (ref 5–15)
BUN: 12 mg/dL (ref 6–20)
CO2: 24 mmol/L (ref 22–32)
Calcium: 10 mg/dL (ref 8.9–10.3)
Chloride: 98 mmol/L (ref 98–111)
Creatinine, Ser: 0.83 mg/dL (ref 0.44–1.00)
GFR calc Af Amer: >60 mL/min (ref >60)
GFR calc non Af Amer: >60 mL/min (ref >60)
Glucose, Bld: 113 mg/dL — ABNORMAL HIGH (ref 70–99)
Potassium: 4.5 mmol/L (ref 3.5–5.1)
Sodium: 140 mmol/L (ref 135–145)
Total Bilirubin: 1.7 mg/dL — ABNORMAL HIGH (ref 0.3–1.2)
Total Protein: 9 g/dL — ABNORMAL HIGH (ref 6.5–8.1)

## 2018-12-15 LAB — URINALYSIS, ROUTINE W REFLEX MICROSCOPIC
Bacteria, UA: NONE SEEN
Bilirubin Urine: NEGATIVE
Glucose, UA: NEGATIVE mg/dL
Ketones, ur: 80 mg/dL — AB
Leukocytes,Ua: NEGATIVE
Nitrite: NEGATIVE
Protein, ur: 100 mg/dL — AB
Specific Gravity, Urine: 1.036 — ABNORMAL HIGH (ref 1.005–1.030)
pH: 6 (ref 5.0–8.0)

## 2018-12-15 LAB — I-STAT BETA HCG BLOOD, ED (MC, WL, AP ONLY): I-stat hCG, quantitative: <5 m[IU]/mL (ref <5)

## 2018-12-15 LAB — LIPASE, BLOOD: Lipase: 26 U/L (ref 11–51)

## 2018-12-15 MED ORDER — ONDANSETRON 4 MG PO TBDP: 4.0000 mg | ORAL_TABLET | Freq: Once | ORAL | Status: DC | PRN

## 2018-12-15 MED ORDER — ONDANSETRON HCL 4 MG/2ML IJ SOLN
4.0000 mg | Freq: Once | INTRAMUSCULAR | Status: AC
  Administered 2018-12-15: 22:00:00 4 mg via INTRAVENOUS
  Filled 2018-12-15: qty 2

## 2018-12-15 MED ORDER — SODIUM CHLORIDE (PF) 0.9 % IJ SOLN
INTRAMUSCULAR | Status: AC
  Administered 2018-12-15: 23:00:00
  Filled 2018-12-15: qty 50

## 2018-12-15 MED ORDER — IOHEXOL 300 MG/ML  SOLN
100.0000 mL | Freq: Once | INTRAMUSCULAR | Status: AC | PRN
  Administered 2018-12-15: 23:00:00 100 mL via INTRAVENOUS

## 2018-12-15 MED ORDER — SODIUM CHLORIDE 0.9% FLUSH: 3.0000 mL | Freq: Once | INTRAVENOUS | Status: DC

## 2018-12-15 MED ORDER — KETOROLAC TROMETHAMINE 30 MG/ML IJ SOLN
30.0000 mg | Freq: Once | INTRAMUSCULAR | Status: AC
  Administered 2018-12-15: 30 mg via INTRAVENOUS
  Filled 2018-12-15: qty 1

## 2018-12-15 MED ORDER — SODIUM CHLORIDE 0.9 % IV BOLUS: 1000.0000 mL | Freq: Once | INTRAVENOUS | Status: DC

## 2018-12-15 MED ORDER — ONDANSETRON 8 MG PO TBDP: ORAL_TABLET | ORAL | 0 refills | Status: AC

## 2018-12-15 NOTE — ED Notes (Signed)
Per pt requests to drink water to induce vomiting. Advised pt no food or drink until visit/labs/diagnostics completed per MD order. Mortimer Fries RN advised. [employer]

## 2018-12-15 NOTE — ED Triage Notes (Signed)
Patient here from home with complaints of abd pain, n/v all day today.

## 2018-12-15 NOTE — Discharge Instructions (Addendum)
Begin taking Zofran as prescribed as needed for nausea.  Return to the emergency department if you develop severe abdominal pain, high fever, bloody stool, or other new and concerning symptoms. 

## 2018-12-15 NOTE — ED Provider Notes (Signed)
[facility] Provider Note   CSN: [id number] Arrival date & time: 12/15/18  2019     History   Chief Complaint Chief Complaint  Patient presents with  . Abdominal Pain  . Nausea  . Emesis    HPI Patricia Beard is a 33 y.o. female.     Patient is a 33 year old female with history of TMJ.  She presents today for evaluation of nausea, vomiting, and abdominal pain.  This began this morning.  She denies any fevers or chills.  She denies any bloody vomit or stool.  She does report she drank a bottle and a half of wine yesterday evening.  She admits to daily alcohol use, however does not generally drink wine.  The history is provided by the patient.  Abdominal Pain Pain location:  Generalized Pain quality: cramping   Pain radiates to:  Does not radiate Pain severity:  Moderate Onset quality:  Sudden Duration:  12 hours Timing:  Constant Progression:  Worsening Chronicity:  New Context: alcohol use   Relieved by:  Nothing Worsened by:  Movement and palpation Associated symptoms: vomiting   Emesis Associated symptoms: abdominal pain     History reviewed. No pertinent past medical history.  Patient Active Problem List   Diagnosis Date Noted  . IUD (intrauterine device) in place 12/06/2013  . TMJ (temporomandibular joint syndrome) 11/30/2013    History reviewed. No pertinent surgical history.   OB History   No obstetric history on file.      Home Medications    Prior to Admission medications   Medication Sig Start Date End Date Taking? Authorizing Provider  acetaminophen (TYLENOL) 500 MG tablet Take 500 mg by mouth every 6 (six) hours as needed for mild pain.    Yes [provider]  dimenhyDRINATE (DRAMAMINE) 50 MG tablet Take 50 mg by mouth every 8 (eight) hours as needed for nausea. OTC   Yes [provider]  ibuprofen (ADVIL,MOTRIN) 200 MG tablet Take 400 mg by mouth every 6 (six) hours as needed for  headache or mild pain.    Yes [provider]  levonorgestrel (MIRENA) 20 MCG/24HR IUD 1 each by Intrauterine route once.   Yes [provider]  ondansetron (ZOFRAN ODT) 8 MG disintegrating tablet Take 1 tablet (8 mg total) by mouth every 8 (eight) hours as needed for nausea. Patient not taking: Reported on 12/15/2018 11/03/17   Derwood Kaplan, MD    Family History No family history on file.  Social History Social History   Tobacco Use  . Smoking status: Current Every Day Smoker    Types: Cigarettes  . Smokeless tobacco: Never Used  . Tobacco comment: 4-5 cigarettes per day  Substance Use Topics  . Alcohol use: Yes    Alcohol/week: 2.0 standard drinks    Types: 2 Standard drinks or equivalent per week    Comment: brandy  . Drug use: No     Allergies   Patient has no known allergies.   Review of Systems Review of Systems  Gastrointestinal: Positive for abdominal pain and vomiting.  All other systems reviewed and are negative.    Physical Exam Updated Vital Signs BP (!) 151/94 (BP Location: Right Arm)   Pulse 89   Temp 98 F (36.7 C) (Oral)   SpO2 99%   Physical Exam Vitals signs and nursing note reviewed.  Constitutional:      General: She is not in acute distress.    Appearance: She is well-developed.  She is not diaphoretic.  HENT:     Head: Normocephalic and atraumatic.  Neck:     Musculoskeletal: Normal range of motion and neck supple.  Cardiovascular:     Rate and Rhythm: Normal rate and regular rhythm.     Heart sounds: No murmur. No friction rub. No gallop.   Pulmonary:     Effort: Pulmonary effort is normal. No respiratory distress.     Breath sounds: Normal breath sounds. No wheezing.  Abdominal:     General: Bowel sounds are normal. There is no distension.     Palpations: Abdomen is soft.     Tenderness: There is generalized abdominal tenderness. There is no right CVA tenderness, left CVA tenderness, guarding or rebound.   Musculoskeletal: Normal range of motion.  Skin:    General: Skin is warm and dry.  Neurological:     Mental Status: She is alert and oriented to person, place, and time.      ED Treatments / Results  Labs (all labs ordered are listed, but only abnormal results are displayed) Labs Reviewed  LIPASE, BLOOD  COMPREHENSIVE METABOLIC PANEL  CBC  URINALYSIS, ROUTINE W REFLEX MICROSCOPIC  I-STAT BETA HCG BLOOD, ED ([facility], [facility], [facility] ONLY)  I-STAT BETA HCG BLOOD, ED ([facility], [facility], [facility] ONLY)    EKG None  Radiology No results found.  Procedures Procedures (including critical care time)  Medications Ordered in ED Medications  sodium chloride flush (NS) 0.9 % injection 3 mL (0 mLs Intravenous Hold 12/15/18 2124)  ondansetron (ZOFRAN-ODT) disintegrating tablet 4 mg (has no administration in time range)  sodium chloride 0.9 % bolus 1,000 mL (has no administration in time range)  ondansetron (ZOFRAN) injection 4 mg (has no administration in time range)  ketorolac (TORADOL) 30 MG/ML injection 30 mg (has no administration in time range)     Initial Impression / Assessment and Plan / ED Course  I have reviewed the triage vital signs and the nursing notes.  Pertinent labs & imaging results that were available during my care of the patient were reviewed by me and considered in my medical decision making (see chart for details).  Patient presenting here with complaints of abdominal pain, nausea, and vomiting.  This started earlier today after having consumed an excessive quantity of wine the night before.  Patient's laboratory studies are unremarkable.  She does have generalized tenderness in the abdomen, however nothing that is focal.    CT scan shows no acute intra-abdominal abnormality.  Patient feeling better after receiving IV fluids and medications here in the ER.  I feel as though she will be appropriate for discharge.  She will be given Zofran which she can take if her nausea returns.  Final  Clinical Impressions(s) / ED Diagnoses   Final diagnoses:  None    ED Discharge Orders    None       Veryl Speak, MD 12/15/18 2349

## 2019-04-06 ENCOUNTER — Emergency Department (HOSPITAL_COMMUNITY)
Admission: EM | Admit: 2019-04-06 | Discharge: 2019-04-06 | Disposition: A | Discharge status: Home / Self Care | Payer: PRIVATE HEALTH INSURANCE | Attending: Emergency Medicine | Admitting: Emergency Medicine

## 2019-04-06 ENCOUNTER — Other Ambulatory Visit: Payer: Self-pay

## 2019-04-06 ENCOUNTER — Encounter (HOSPITAL_COMMUNITY): Payer: Self-pay | Admitting: *Deleted

## 2019-04-06 DIAGNOSIS — R112 Nausea with vomiting, unspecified: Secondary | ICD-10-CM | POA: Insufficient documentation

## 2019-04-06 DIAGNOSIS — Z975 Presence of (intrauterine) contraceptive device: Secondary | ICD-10-CM | POA: Diagnosis not present

## 2019-04-06 DIAGNOSIS — F17210 Nicotine dependence, cigarettes, uncomplicated: Secondary | ICD-10-CM | POA: Insufficient documentation

## 2019-04-06 LAB — COMPREHENSIVE METABOLIC PANEL
ALT: 29 U/L (ref 0–44)
AST: 27 U/L (ref 15–41)
Albumin: 5 g/dL (ref 3.5–5.0)
Alkaline Phosphatase: 68 U/L (ref 38–126)
Anion gap: 16 — ABNORMAL HIGH (ref 5–15)
BUN: 11 mg/dL (ref 6–20)
CO2: 23 mmol/L (ref 22–32)
Calcium: 9.5 mg/dL (ref 8.9–10.3)
Chloride: 102 mmol/L (ref 98–111)
Creatinine, Ser: 0.76 mg/dL (ref 0.44–1.00)
GFR calc Af Amer: >60 mL/min (ref >60)
GFR calc non Af Amer: >60 mL/min (ref >60)
Glucose, Bld: 121 mg/dL — ABNORMAL HIGH (ref 70–99)
Potassium: 4.2 mmol/L (ref 3.5–5.1)
Sodium: 141 mmol/L (ref 135–145)
Total Bilirubin: 0.9 mg/dL (ref 0.3–1.2)
Total Protein: 8.1 g/dL (ref 6.5–8.1)

## 2019-04-06 LAB — CBC
HCT: 45.2 % (ref 36.0–46.0)
Hemoglobin: 14.4 g/dL (ref 12.0–15.0)
MCH: 28.9 pg (ref 26.0–34.0)
MCHC: 31.9 g/dL (ref 30.0–36.0)
MCV: 90.8 fL (ref 80.0–100.0)
Platelets: 344 10*3/uL (ref 150–400)
RBC: 4.98 MIL/uL (ref 3.87–5.11)
RDW: 14.4 % (ref 11.5–15.5)
WBC: 8.6 10*3/uL (ref 4.0–10.5)
nRBC: 0 % (ref 0.0–0.2)

## 2019-04-06 LAB — I-STAT BETA HCG BLOOD, ED (MC, WL, AP ONLY): I-stat hCG, quantitative: <5 m[IU]/mL (ref <5)

## 2019-04-06 LAB — LIPASE, BLOOD: Lipase: 23 U/L (ref 11–51)

## 2019-04-06 MED ORDER — SODIUM CHLORIDE 0.9% FLUSH: 3.0000 mL | Freq: Once | INTRAVENOUS | Status: DC

## 2019-04-06 MED ORDER — ONDANSETRON HCL 4 MG/2ML IJ SOLN
4.0000 mg | Freq: Once | INTRAMUSCULAR | Status: AC
  Administered 2019-04-06: 4 mg via INTRAVENOUS
  Filled 2019-04-06: qty 2

## 2019-04-06 MED ORDER — SODIUM CHLORIDE 0.9 % IV BOLUS
1000.0000 mL | Freq: Once | INTRAVENOUS | Status: AC
  Administered 2019-04-06: 1000 mL via INTRAVENOUS

## 2019-04-06 NOTE — ED Triage Notes (Signed)
Patient reports that she drank a lot of alcohol last night and today she has been vomiting and had a small amount of diarrhea.  Patient states she took Zofran prior to coming to the ED.

## 2019-04-06 NOTE — Discharge Instructions (Addendum)
Please call your primary doctor to get follow-up appointment regarding symptoms from today.  Recommend discussing alcohol cessation with them.  Recommend considering stopping your alcohol abuse.  If your pain worsens, vomiting worsens or you develop another new concerning symptom, return to ER for reassessment.

## 2019-04-06 NOTE — ED Triage Notes (Signed)
Pt states she was drinking alcohol last night, has been throwing up all day.States she is dehydrated

## 2019-04-07 NOTE — ED Provider Notes (Signed)
[facility] DEPT Provider Note   CSN: [id number] Arrival date & time: 04/06/19  1810     History Chief Complaint  Patient presents with  . Emesis  . Diarrhea    Patricia Beard is a 34 y.o. female.  Presents ER chief complaint dehydration.  Patient states that she is alcoholic, drinks one extra large bottle of wine every day.  No changes in her drinking habits recently except last night drank more than normal.  States periodically she has episodes of nausea, vomiting and feels very dehydrated.  Episode today started this morning.  Has had innumerable numbers of vomiting today.  All nonbloody and nonbilious.  Not currently having abdominal pain.  Has had some loose stools, no blood in stool.  HPI     History reviewed. No pertinent past medical history.  Patient Active Problem List   Diagnosis Date Noted  . IUD (intrauterine device) in place 12/06/2013  . TMJ (temporomandibular joint syndrome) 11/30/2013    History reviewed. No pertinent surgical history.   OB History   No obstetric history on file.     Family History  Family history unknown: Yes    Social History   Tobacco Use  . Smoking status: Current Every Day Smoker    Packs/day: 0.25    Types: Cigarettes  . Smokeless tobacco: Never Used  . Tobacco comment: 4-5 cigarettes per day  Substance Use Topics  . Alcohol use: Yes    Alcohol/week: 2.0 standard drinks    Types: 2 Standard drinks or equivalent per week    Comment: brandy  . Drug use: Yes    Types: Marijuana    Home Medications Prior to Admission medications   Medication Sig Start Date End Date Taking? Authorizing Provider  acetaminophen (TYLENOL) 500 MG tablet Take 500 mg by mouth every 6 (six) hours as needed for mild pain.    Yes [provider]  dimenhyDRINATE (DRAMAMINE) 50 MG tablet Take 50 mg by mouth every 8 (eight) hours as needed for nausea. OTC   Yes [provider]  ibuprofen (ADVIL,MOTRIN)  200 MG tablet Take 400 mg by mouth every 6 (six) hours as needed for headache or mild pain.    Yes [provider]  levonorgestrel (MIRENA) 20 MCG/24HR IUD 1 each by Intrauterine route once.   Yes [provider]  ondansetron (ZOFRAN ODT) 8 MG disintegrating tablet 8mg  ODT q4 hours prn nausea Patient not taking: Reported on 04/06/2019 12/15/18   Veryl Speak, MD    Allergies    Patient has no known allergies.  Review of Systems   Review of Systems  Constitutional: Negative for chills and fever.  HENT: Negative for ear pain and sore throat.   Eyes: Negative for pain and visual disturbance.  Respiratory: Negative for cough and shortness of breath.   Cardiovascular: Negative for chest pain and palpitations.  Gastrointestinal: Positive for nausea and vomiting. Negative for abdominal pain.  Genitourinary: Negative for dysuria and hematuria.  Musculoskeletal: Negative for arthralgias and back pain.  Skin: Negative for color change and rash.  Neurological: Negative for seizures and syncope.  All other systems reviewed and are negative.   Physical Exam Updated Vital Signs BP 100/64   Pulse 82   Temp 98.5 F (36.9 C)   Resp 16   Ht 5\' 4"  (1.626 m)   Wt 63.5 kg   SpO2 98%   BMI 24.03 kg/m   Physical Exam Vitals and nursing note reviewed.  Constitutional:  General: She is not in acute distress.    Appearance: She is well-developed.  HENT:     Head: Normocephalic and atraumatic.  Eyes:     Conjunctiva/sclera: Conjunctivae normal.  Cardiovascular:     Rate and Rhythm: Normal rate and regular rhythm.     Heart sounds: No murmur.  Pulmonary:     Effort: Pulmonary effort is normal. No respiratory distress.     Breath sounds: Normal breath sounds.  Abdominal:     Palpations: Abdomen is soft.     Tenderness: There is no abdominal tenderness.  Musculoskeletal:        General: No deformity or signs of injury.     Cervical back: Neck supple.  Skin:     General: Skin is warm and dry.     Capillary Refill: Capillary refill takes less than 2 seconds.  Neurological:     General: No focal deficit present.     Mental Status: She is alert and oriented to person, place, and time.     ED Results / Procedures / Treatments   Labs (all labs ordered are listed, but only abnormal results are displayed) Labs Reviewed  COMPREHENSIVE METABOLIC PANEL - Abnormal; Notable for the following components:      Result Value   Glucose, Bld 121 (*)    Anion gap 16 (*)    All other components within normal limits  LIPASE, BLOOD  CBC  URINALYSIS, ROUTINE W REFLEX MICROSCOPIC  I-STAT BETA HCG BLOOD, ED ([facility], [facility], [facility] ONLY)    EKG None  Radiology No results found.  Procedures Procedures (including critical care time)  Medications Ordered in ED Medications  sodium chloride flush (NS) 0.9 % injection 3 mL (has no administration in time range)  sodium chloride 0.9 % bolus 1,000 mL (0 mLs Intravenous Stopped 04/06/19 2358)  ondansetron (ZOFRAN) injection 4 mg (4 mg Intravenous Given 04/06/19 2206)    ED Course  I have reviewed the triage vital signs and the nursing notes.  Pertinent labs & imaging results that were available during my care of the patient were reviewed by me and considered in my medical decision making (see chart for details).    MDM Rules/Calculators/A&P                      35 year old lady notable history for alcohol abuse presented to ER with nausea and vomiting.  On exam patient is well-appearing with normal vital signs, abdomen is soft and nontender, she has no associated abdominal pain.  Labs are grossly within normal limits, noted slight elevation in anion gap suggesting dehydration.  Provided patient rehydration, nausea medicine and symptoms had resolved.  Suspect symptoms today related to her alcohol abuse.  Will discharge home.  Counseled on alcohol cessation, recommend close follow-up with PCP regarding alcohol abuse.    After  the discussed management above, the patient was determined to be safe for discharge.  The patient was in agreement with this plan and all questions regarding their care were answered.  ED return precautions were discussed and the patient will return to the ED with any significant worsening of condition.    Final Clinical Impression(s) / ED Diagnoses Final diagnoses:  Non-intractable vomiting with nausea, unspecified vomiting type    Rx / DC Orders ED Discharge Orders    None       Milagros Loll, MD 04/07/19 1529

## 2020-11-15 IMAGING — CT CT ABD-PELV W/ CM
2 of 4 series · 16 of 46 positions shown, 18 images · IV contrast (omnipaque)
Comparison: None.

CLINICAL DATA: Abdomen pain nausea vomiting

EXAM:
CT ABDOMEN AND PELVIS WITH CONTRAST
TECHNIQUE: Multidetector CT imaging of the abdomen and pelvis was performed
using the standard protocol following bolus administration of
intravenous contrast.
CONTRAST:  100mL OMNIPAQUE IOHEXOL 300 MG/ML  SOLN

[Series 3: axial st · axial · 0.67mm/px · z∈[+1190,+1594]mm · 13 of 93 slices shown, 15 images]
[im 6/93  soft-tissue]
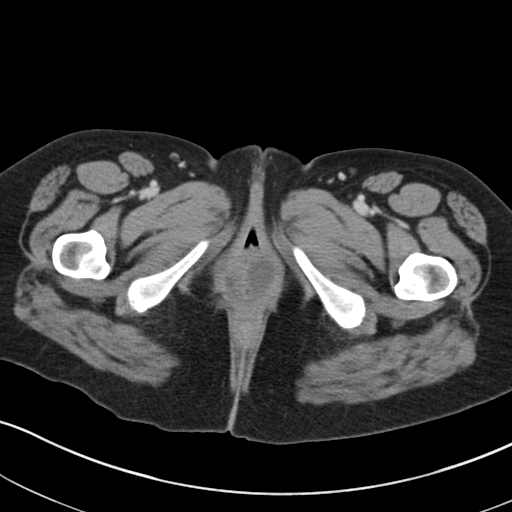
[im 6/93  bone]
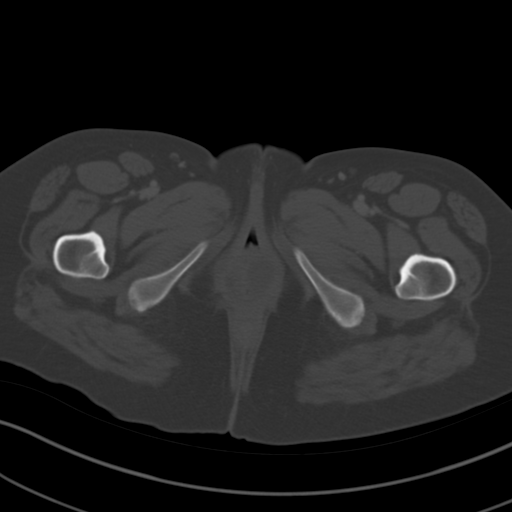
[im 11/93  soft-tissue]
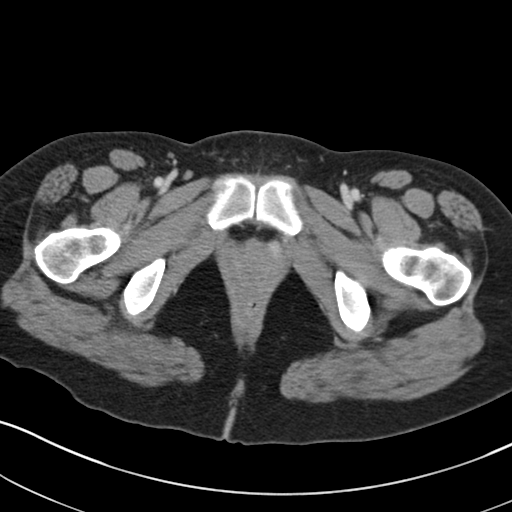
[im 22/93  soft-tissue]
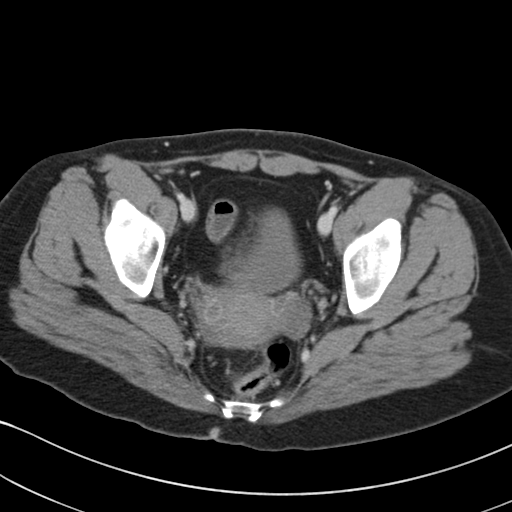
[im 28/93  soft-tissue]
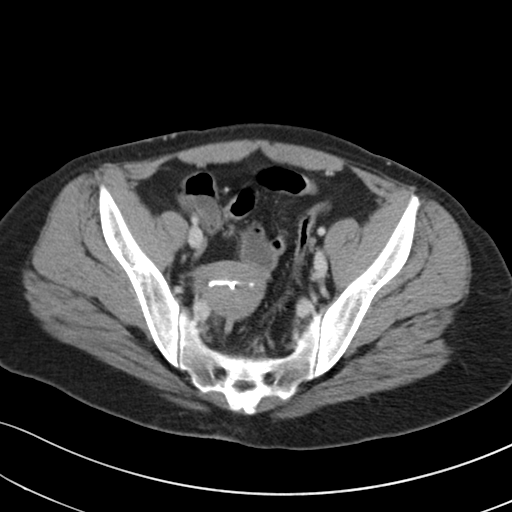
[im 33/93  soft-tissue]
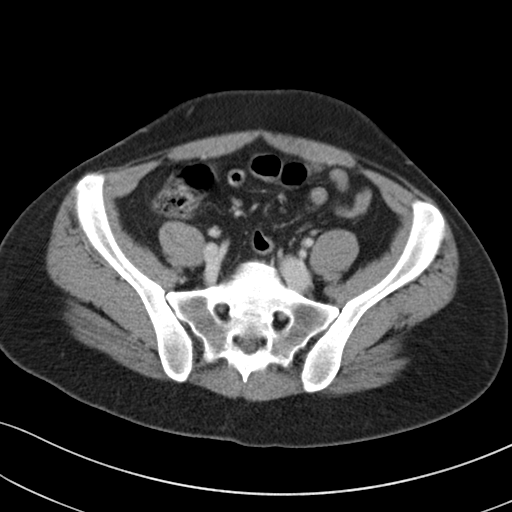
[im 38/93  soft-tissue]
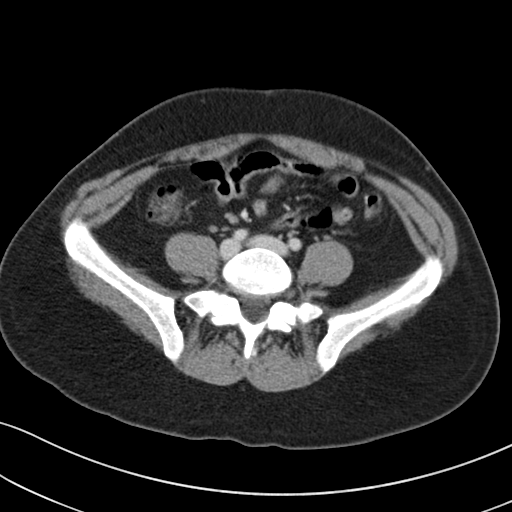
[im 49/93  soft-tissue]
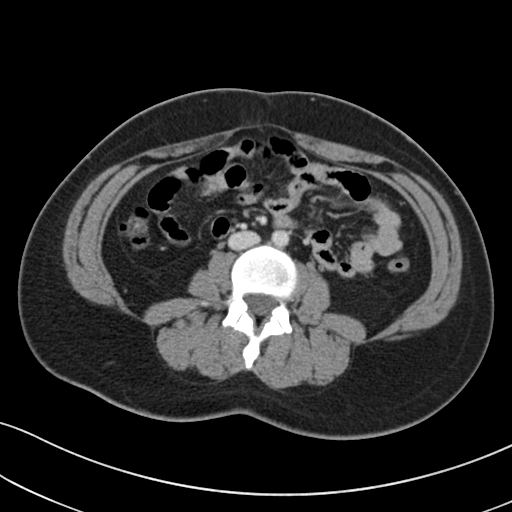
[im 55/93  soft-tissue]
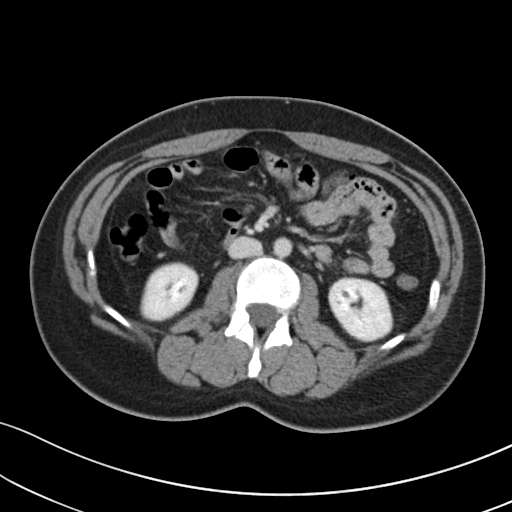
[im 60/93  soft-tissue]
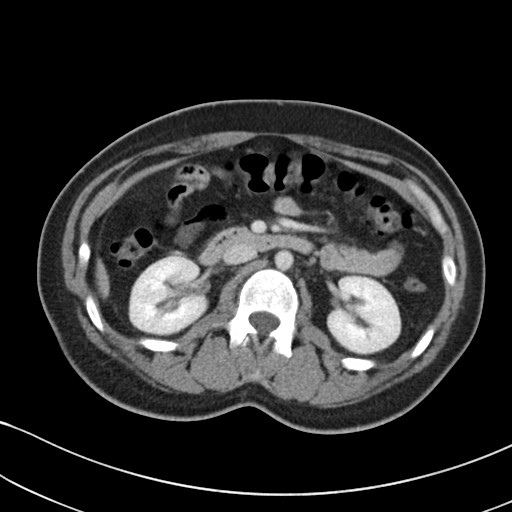
[im 60/93  bone]
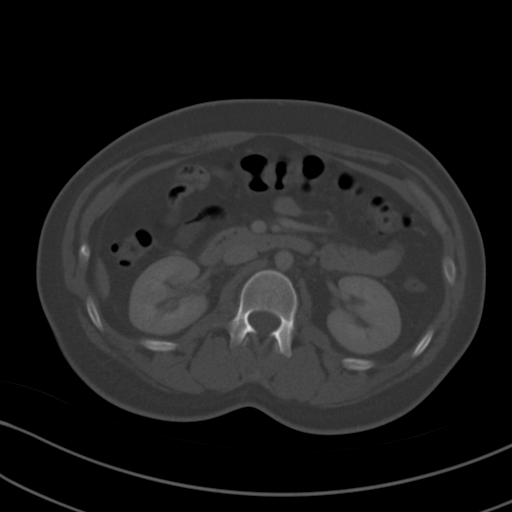
[im 65/93  soft-tissue]
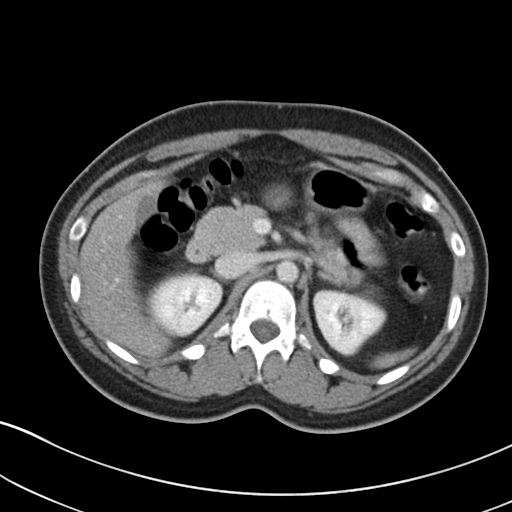
[im 71/93  soft-tissue]
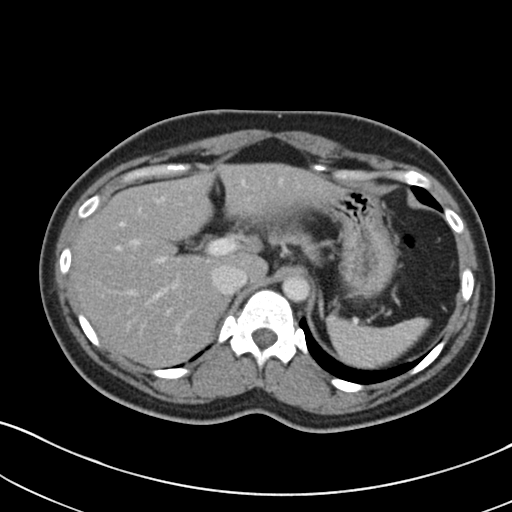
[im 82/93  soft-tissue]
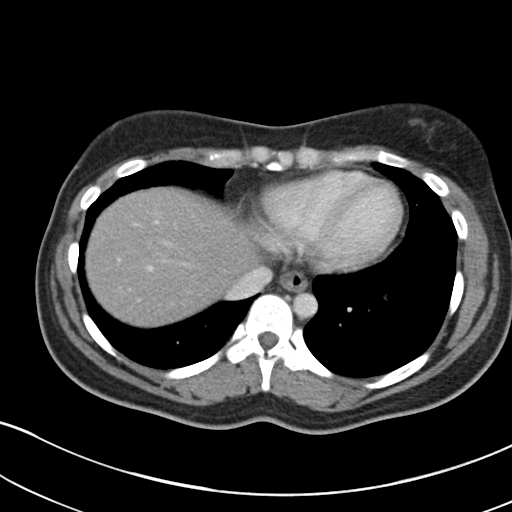
[im 87/93  soft-tissue]
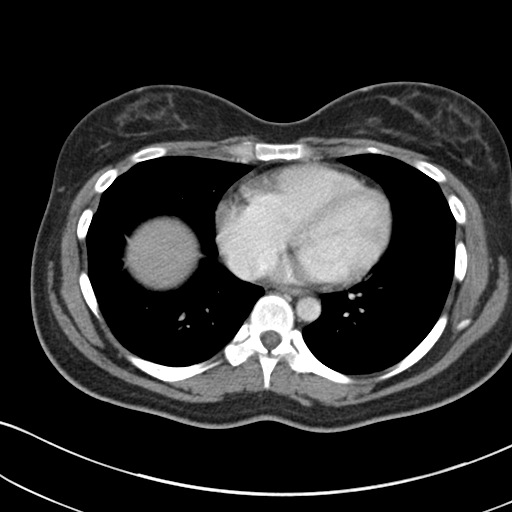

[Series 5: coronal st · coronal · 0.68mm/px · 3 of 118 slices shown]
[im 40/118  soft-tissue]
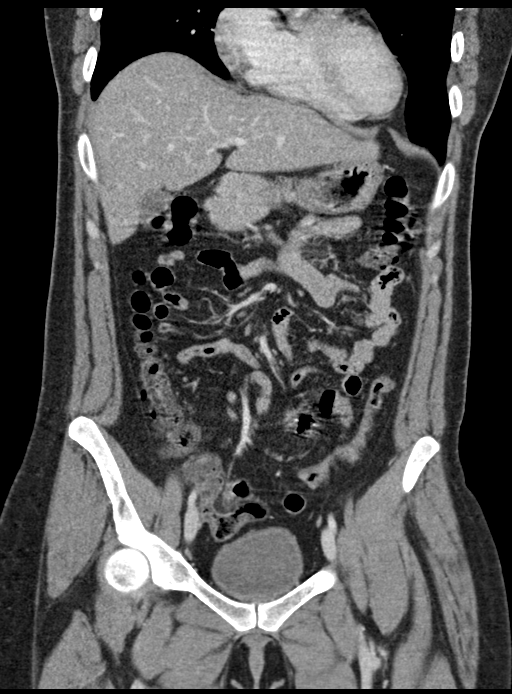
[im 53/118  soft-tissue]
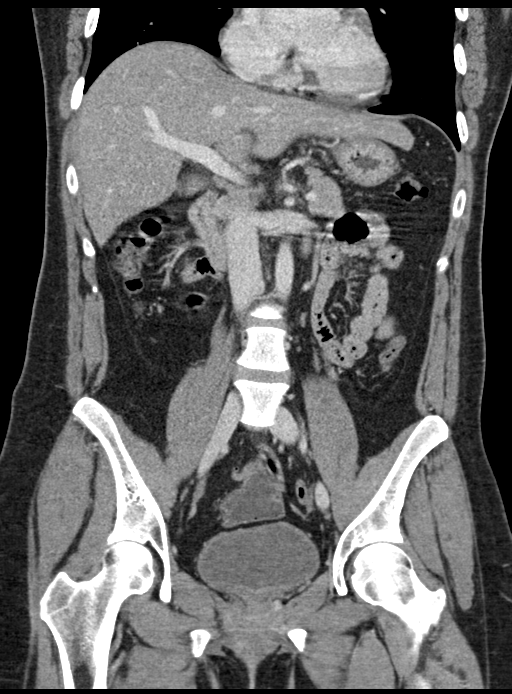
[im 66/118  soft-tissue]
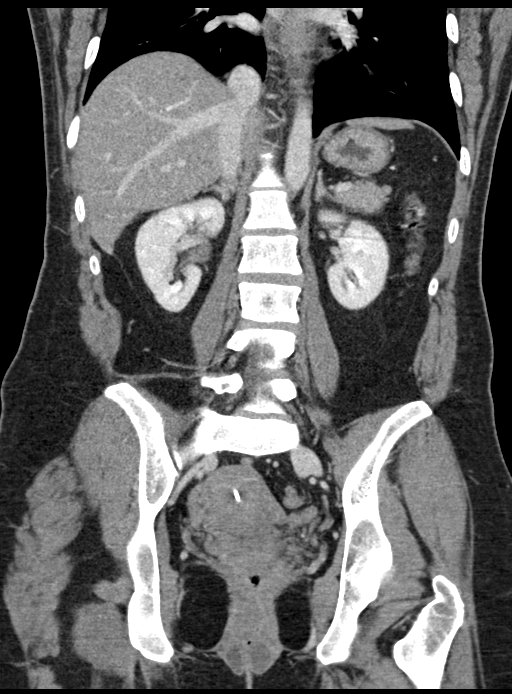

[16 of 46 positions shown; findings below may reference images not displayed]

FINDINGS: Lower chest: No acute abnormality.

Hepatobiliary: No focal liver abnormality is seen. No gallstones,
gallbladder wall thickening, or biliary dilatation.

Pancreas: Unremarkable. No pancreatic ductal dilatation or
surrounding inflammatory changes.

Spleen: Normal in size without focal abnormality.

Adrenals/Urinary Tract: Adrenal glands are unremarkable. Kidneys are
normal, without renal calculi, focal lesion, or hydronephrosis.
Bladder is unremarkable.

Stomach/Bowel: Stomach is within normal limits. Appendix appears
normal. No evidence of bowel wall thickening, distention, or
inflammatory changes.

Vascular/Lymphatic: No significant vascular findings are present. No
enlarged abdominal or pelvic lymph nodes.

Reproductive: IUD in the uterus. Ring-enhancing follicle or cyst in
the right ovary. Slightly complex cystic collections in the left
greater than right labia measuring 3.1 cm on the left and 2.2 cm on
the right.

Other: Negative for free air or free fluid

Musculoskeletal: No acute or significant osseous findings.
IMPRESSION: 1. No CT evidence for acute intra-abdominal or pelvic abnormality.
2. Left greater than right slightly complex labia cysts
